# Patient Record
Sex: Female | Born: 1970 | Race: Black or African American | Hispanic: No | Marital: Single | Smoking: Never smoker
Health system: Southern US, Community
[De-identification: ages and names within clinical notes are randomized; demographics above are authoritative.]

## PROBLEM LIST (undated history)

## (undated) DIAGNOSIS — I10 Essential (primary) hypertension: Secondary | ICD-10-CM

## (undated) DIAGNOSIS — E669 Obesity, unspecified: Secondary | ICD-10-CM

## (undated) DIAGNOSIS — D649 Anemia, unspecified: Secondary | ICD-10-CM

## (undated) HISTORY — DX: Obesity, unspecified: E66.9

## (undated) HISTORY — PX: OTHER SURGICAL HISTORY: SHX169

## (undated) HISTORY — DX: Anemia, unspecified: D64.9

## (undated) HISTORY — DX: Essential (primary) hypertension: I10

---

## 1999-01-17 ENCOUNTER — Other Ambulatory Visit: Admission: RE | Admit: 1999-01-17 | Discharge: 1999-01-17 | Payer: Self-pay | Admitting: Obstetrics and Gynecology

## 1999-10-13 ENCOUNTER — Emergency Department (HOSPITAL_COMMUNITY): Admission: EM | Admit: 1999-10-13 | Discharge: 1999-10-14 | Payer: Self-pay | Admitting: Emergency Medicine

## 2003-03-17 ENCOUNTER — Other Ambulatory Visit: Admission: RE | Admit: 2003-03-17 | Discharge: 2003-03-17 | Payer: Self-pay | Admitting: Obstetrics and Gynecology

## 2004-07-05 ENCOUNTER — Encounter: Admission: RE | Admit: 2004-07-05 | Discharge: 2004-07-05 | Payer: Self-pay | Admitting: Family Medicine

## 2007-05-15 ENCOUNTER — Emergency Department (HOSPITAL_COMMUNITY): Admission: EM | Admit: 2007-05-15 | Discharge: 2007-05-15 | Payer: Self-pay | Admitting: Emergency Medicine

## 2007-05-21 ENCOUNTER — Ambulatory Visit (HOSPITAL_COMMUNITY): Admission: RE | Admit: 2007-05-21 | Discharge: 2007-05-22 | Payer: Self-pay | Admitting: Orthopaedic Surgery

## 2007-11-19 IMAGING — CR DG FOOT COMPLETE 3+V*L*
3 series · 3 of 3 positions shown · non-contrast
Comparison: None
COMPARISON: None

CLINICAL DATA: Trauma

LEFT FOOT - 3 VIEW
CLINICAL DATA: Same as above
LEFT ANKLE - 3 VIEW

[t foot ap left]
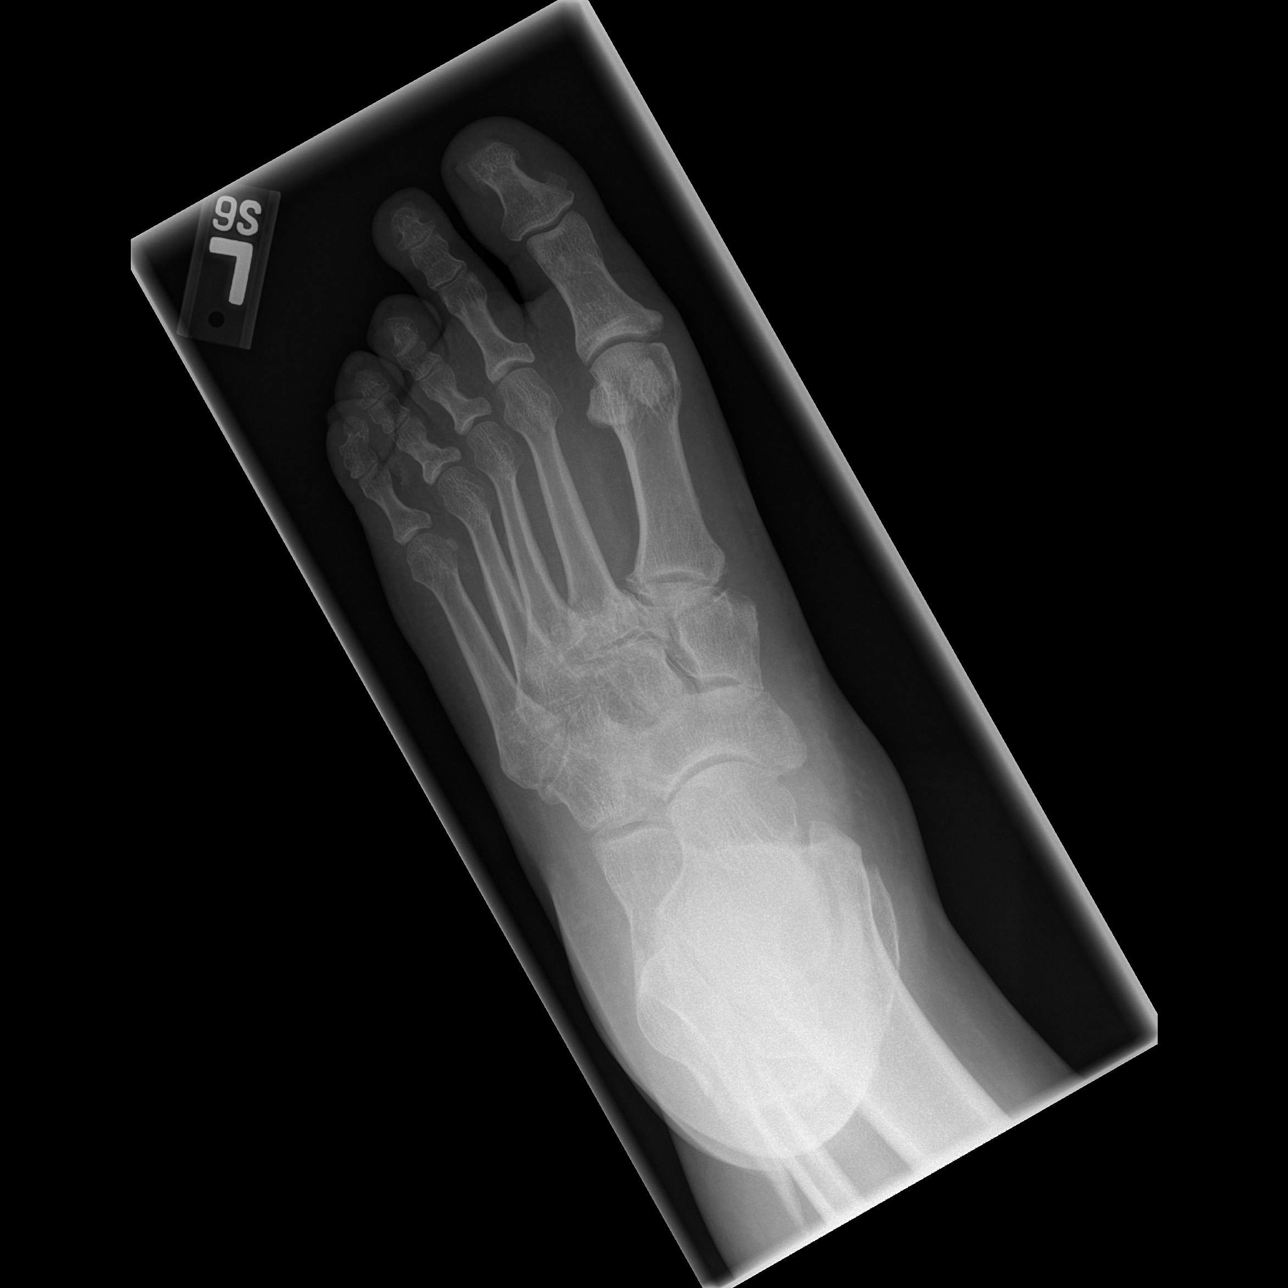

[t foot oblique left]
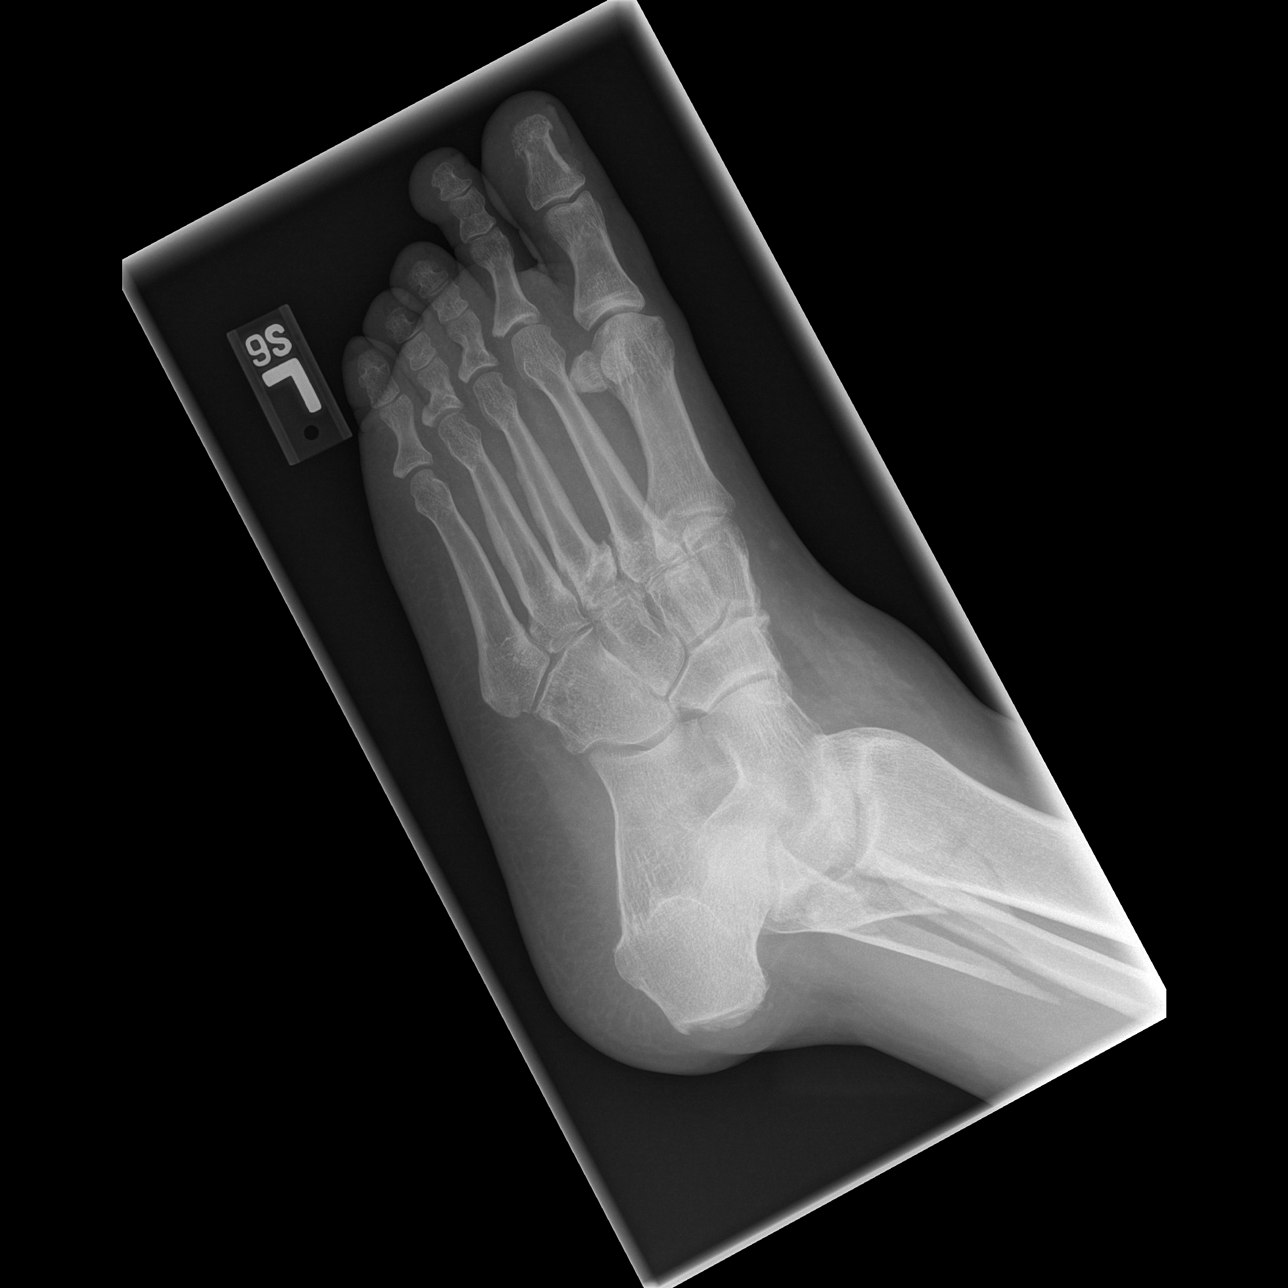

[t foot lat left]
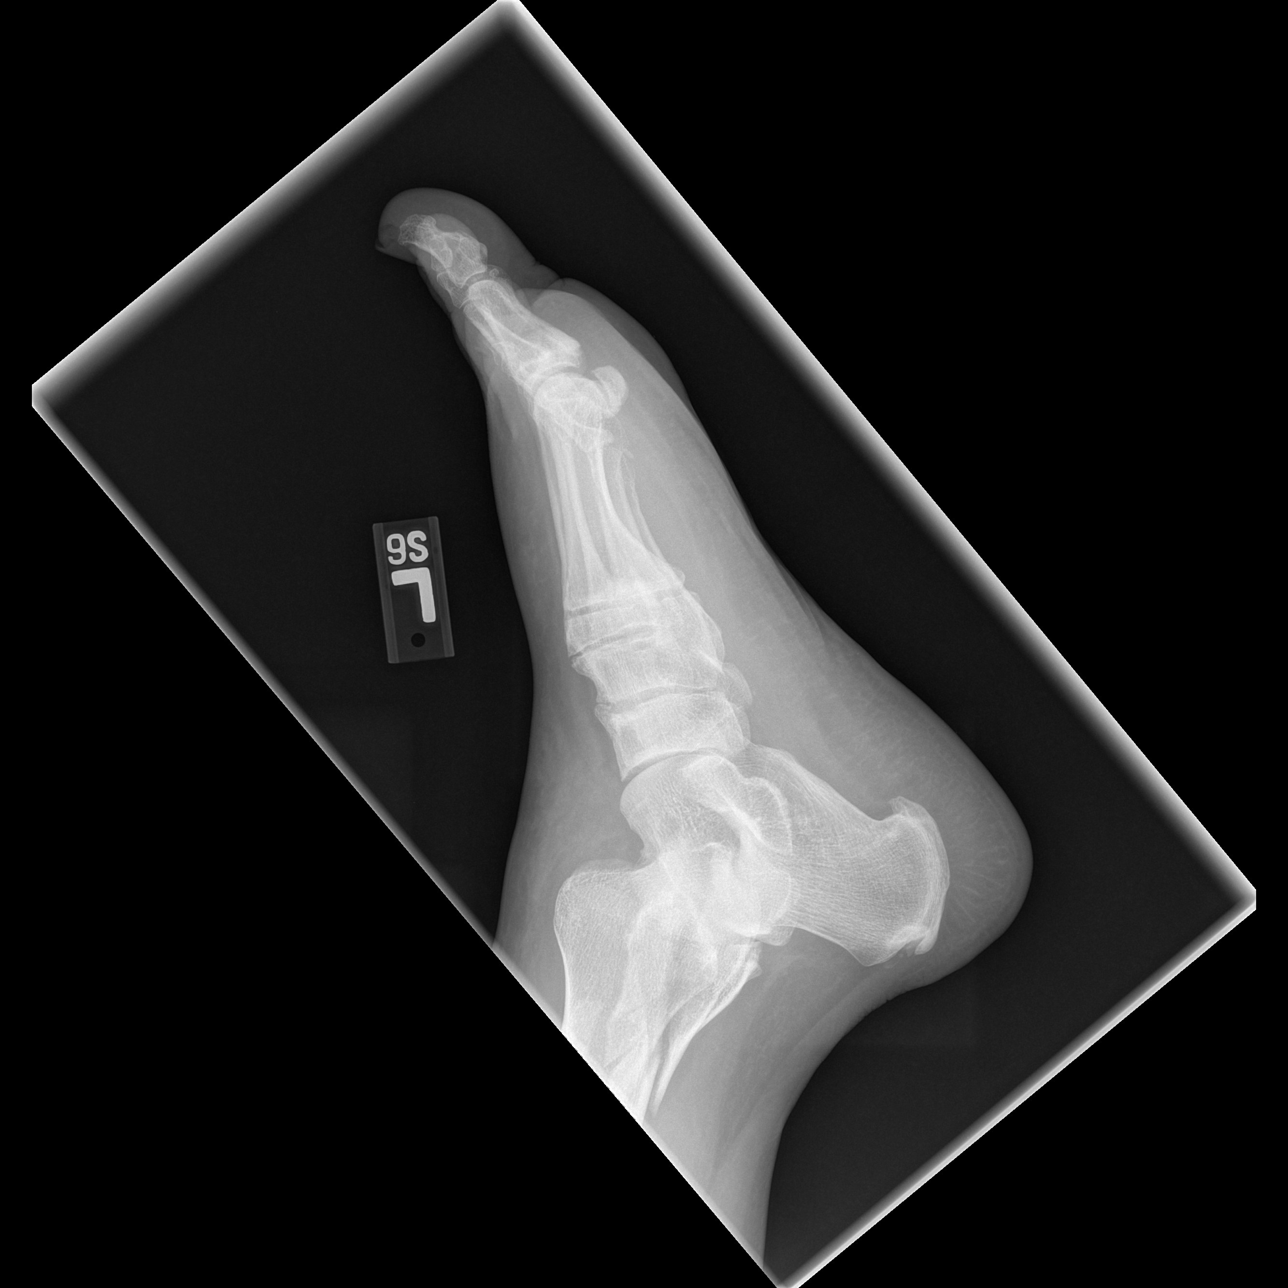

[3 of 3 positions shown; findings below may reference images not displayed]

FINDINGS: Left ankle fractures to be described below. dorsal midfoot soft
tissue swelling. Small Achilles and calcaneal spurs.

Irregularity at the tarsal/metatarsal joints medially felt to be related to age
advanced degenerative change/arthropathy. No acute foot fracture

IMPRESSION

1. Ankle fractures to be described below.
2. No convincing evidence of foot fracture. Irregularity at the tarsometatarsal
joints medially is felt to be due to age advanced degenerative change. Correlate
with any risk factors for other arthropathies (diabetes/Charcot joint). If
patient undergoes ankle CT, extension through the midfoot should be considered
if there are midfoot symptoms.
3. Dorsal soft tissue swelling.
Please note this is part of a multistudy report.  Please see remainder of
report.
FINDINGS: Bimalleolar soft tissue swelling. Distortion of AP and mortise views
due to the extent of fracture subluxation. Comminuted distal fibular fracture.
Comminuted intra-articular medial malleolar fracture. Widening of ankle mortise
medially. Lateral view demonstrates posterior subluxation of the talus relative
to the tibia with a posterior malleolar fracture identified. No definite talar
dome injury. Base of fifth metatarsal appears intact.

IMPRESSION

1. Complex fracture subluxation of the ankle as described. Consider further
evaluation with CT.
Please note this is part of a multistudy report.  Please see remainder of
report.

## 2008-06-10 ENCOUNTER — Encounter: Admission: RE | Admit: 2008-06-10 | Discharge: 2008-06-10 | Payer: Self-pay | Admitting: Obstetrics and Gynecology

## 2010-05-13 ENCOUNTER — Ambulatory Visit: Payer: Self-pay | Admitting: Oncology

## 2011-01-24 NOTE — Op Note (Signed)
Debbie Randall, Debbie Randall NO.:  [id number]   MEDICAL RECORD NO.:  [id number]          PATIENT TYPE:  OIB   LOCATION:  5014                         FACILITY:  [facility]   PHYSICIAN:  Vanita Panda. Magnus Ivan, M.D.DATE OF BIRTH:  1971/02/15   DATE OF PROCEDURE:  DATE OF DISCHARGE:                               OPERATIVE REPORT   PREOPERATIVE DIAGNOSIS:  Left ankle pilon fracture.   POSTOPERATIVE DIAGNOSIS:  Left ankle pilon fracture.   PROCEDURE:  Open reduction internal fixation of left pilon fracture  including fixation of the fibula and tibia.   SURGEON:  Vanita Panda. Magnus Ivan, M.D.   ANESTHESIA:  General.   ANTIBIOTICS:  Two grams IV Ancef.   TOURNIQUET TIME:  Two hours.   BLOOD LOSS:  Minimal.   COMPLICATIONS:  None.   INDICATIONS:  Briefly Debbie Randall is a 40 year old who last week actually  tripped and fell down some stairs while at home.  She sustained an  injury to her left ankle.  She was seen in the emergency room, and a CT  scan was obtained of the ankle and showed an intra-articular pilon  fracture with minimal displacement.  Due to the unstable nature of this  fracture, it was recommend she undergo open reduction and internal  fixation of both the fibula and the tibia.  The risks and benefits of  this were explained her and well understood.  She agreed to proceed with  surgery.   PROCEDURE DESCRIPTION:  After informed consent was obtained, the  appropriate left ankle was marked.  Debbie Randall was brought to the  operating room, placed supine on the operating table.  General  anesthesia was then obtained.  Her left leg was then prepped and draped  with DuraPrep and sterile drapes.  A nonsterile tourniquet was placed  around her upper thigh.  Timeout was called to identify the correct  patient, the correct extremity.  We then used an Esmarch wrap on the leg  and the tourniquet was inflated to 350 mm of pressure.  First made an  incision directly over  the fibula for fixation of the fibula.  Once I  had carried the dissection down the soft tissues to the fibula I was  able to clean the fracture hematoma.  Using reduction forceps I was able  to reduce the fibula into an anatomic position.  I placed 2 lag screws  from anterior to posterior oblique to the fracture plane and then  supplemented this with a 1/3 semitubular locking plate as a buttress  plate on the lateral cortex of the fibula.  Once this was secured under  direct fluoroscopic guidance I found the fibula to be reduced.  I  irrigated the tissues and closed the deep tissue with 0 Vicryl over the  incision followed by 2-0 Vicryl in the subcutaneous tissue and  interrupted 2-0 nylon on the skin.   Next, attention was turned toward fixation of the tibia.  An incision  was made directly over the medial malleolus and I extended this  proximally and distally.  I was able to dissect down  to the fracture  fragments and found 3 large fracture fragments.  I was able to tease the  fracture fragments into place including reduction of the articular  surface, and this was again accomplished under direct fluoroscopic  guidance.  I then chose a Synthes periarticular distal tibial locking  plate which is a distal medial locking plate and passed this along the  cortex.  This was then secured with screws proximally and distally with  some of those proximal to the articular surface to further buttress the  fracture.  Again, this was performed under direct fluoroscopic guidance.  I put the ankle, after this, through a range of motion and it was found  to be stable fixation.  I then irrigated the medial wound and closed the  deep tissue with 0 Vicryl followed by 2-0 Vicryl in the subcutaneous  tissue and 2-0 nylon on the skin.  Xeroform __________  well-padded  sterile dressing were applied.  The tourniquet was let down and  hemostasis was obtained.  The toes did pinken nicely.  A well-padded   splint was applied.  The patient was awakened, extubated and taken to  the recovery room in stable condition.      Vanita Panda. Magnus Ivan, M.D.  Electronically Signed     CYB/MEDQ  D:  05/21/2007  T:  05/22/2007  Job:  [id number]

## 2011-06-23 LAB — BASIC METABOLIC PANEL
BUN: 8
CO2: 26
Calcium: 9.4
Creatinine, Ser: 0.58
Glucose, Bld: 108 — ABNORMAL HIGH

## 2011-06-23 LAB — CBC
Hemoglobin: 12.4
MCHC: 33.7
RDW: 14.1 — ABNORMAL HIGH

## 2012-09-23 ENCOUNTER — Other Ambulatory Visit: Payer: Self-pay | Admitting: Obstetrics and Gynecology

## 2012-09-23 DIAGNOSIS — Z1231 Encounter for screening mammogram for malignant neoplasm of breast: Secondary | ICD-10-CM

## 2012-10-22 ENCOUNTER — Ambulatory Visit
Admission: RE | Admit: 2012-10-22 | Discharge: 2012-10-22 | Disposition: A | Discharge status: Home / Self Care | Payer: PRIVATE HEALTH INSURANCE | Source: Ambulatory Visit | Attending: Obstetrics and Gynecology | Admitting: Obstetrics and Gynecology

## 2012-10-22 DIAGNOSIS — Z1231 Encounter for screening mammogram for malignant neoplasm of breast: Secondary | ICD-10-CM

## 2013-11-14 ENCOUNTER — Other Ambulatory Visit: Payer: Self-pay

## 2013-11-14 DIAGNOSIS — Z1231 Encounter for screening mammogram for malignant neoplasm of breast: Secondary | ICD-10-CM

## 2013-12-04 ENCOUNTER — Ambulatory Visit
Admission: RE | Admit: 2013-12-04 | Discharge: 2013-12-04 | Disposition: A | Discharge status: Home / Self Care | Payer: No Typology Code available for payment source | Source: Ambulatory Visit

## 2013-12-04 DIAGNOSIS — Z1231 Encounter for screening mammogram for malignant neoplasm of breast: Secondary | ICD-10-CM

## 2014-06-17 ENCOUNTER — Encounter: Payer: Self-pay | Admitting: Cardiovascular Disease

## 2014-06-17 ENCOUNTER — Ambulatory Visit (INDEPENDENT_AMBULATORY_CARE_PROVIDER_SITE_OTHER): Payer: No Typology Code available for payment source | Admitting: Cardiovascular Disease

## 2014-06-17 VITALS — BP 164/98 | HR 87 | Ht 64.0 in | Wt 286.0 lb

## 2014-06-17 DIAGNOSIS — R0602 Shortness of breath: Secondary | ICD-10-CM

## 2014-06-17 DIAGNOSIS — R011 Cardiac murmur, unspecified: Secondary | ICD-10-CM | POA: Insufficient documentation

## 2014-06-17 DIAGNOSIS — I10 Essential (primary) hypertension: Secondary | ICD-10-CM | POA: Insufficient documentation

## 2014-06-17 DIAGNOSIS — R0600 Dyspnea, unspecified: Secondary | ICD-10-CM | POA: Insufficient documentation

## 2014-06-17 DIAGNOSIS — R600 Localized edema: Secondary | ICD-10-CM | POA: Insufficient documentation

## 2014-06-17 NOTE — Assessment & Plan Note (Signed)
Debbie Randall  presents for further evaluation of her cardiac murmur. Murmur sounds fairly benign. She may have small amount of mitral regurgitation or tricuspid regurgitation.  We'll get an echocardiogram for further evaluation. She also has a history of shortness breath and hypertension.
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
## 2014-06-17 NOTE — Assessment & Plan Note (Signed)
I think this is due to generalized deconditioning and her obesity. Her BNP is 5 - no evidence of CHf. I've encouraged her to exercise on a regular basis.   I will see her on an as needed basis.

## 2014-06-17 NOTE — Patient Instructions (Addendum)
Your physician has requested that you have an echocardiogram. Echocardiography is a painless test that uses sound waves to create images of your heart. It provides your doctor with information about the size and shape of your heart and how well your heart's chambers and valves are working. This procedure takes approximately one hour. There are no restrictions for this procedure.  Your physician recommends that you continue on your current medications as directed. Please refer to the Current Medication list given to you today.  Your physician recommends that you schedule a follow-up appointment in: as needed with Dr. Nahser   

## 2014-06-17 NOTE — Assessment & Plan Note (Signed)
Primarily due to an ankle injury . No evidnece of CHf BNP = 5

## 2014-06-17 NOTE — Assessment & Plan Note (Signed)
Her blood pressure has been managed by her medical doctor. I think that she still feels a bit too much salt. We'll have her Dr. continue to work with her for bdetter control of her blood pressure.

## 2014-06-23 ENCOUNTER — Other Ambulatory Visit (HOSPITAL_COMMUNITY): Payer: No Typology Code available for payment source

## 2014-06-24 ENCOUNTER — Encounter (HOSPITAL_COMMUNITY): Payer: Self-pay | Admitting: Cardiovascular Disease

## 2014-07-21 ENCOUNTER — Other Ambulatory Visit: Payer: Self-pay | Admitting: Family Medicine

## 2014-07-21 DIAGNOSIS — R1011 Right upper quadrant pain: Secondary | ICD-10-CM

## 2014-07-29 ENCOUNTER — Ambulatory Visit
Admission: RE | Admit: 2014-07-29 | Discharge: 2014-07-29 | Disposition: A | Discharge status: Home / Self Care | Payer: No Typology Code available for payment source | Source: Ambulatory Visit | Attending: Family Medicine | Admitting: Family Medicine

## 2014-07-29 DIAGNOSIS — R1011 Right upper quadrant pain: Secondary | ICD-10-CM

## 2015-02-02 IMAGING — US US ABDOMEN COMPLETE
1 series · 14 of 25 positions shown · non-contrast
Comparison: No priors.

CLINICAL DATA: 43-year-old female with right upper quadrant
abdominal pain for the past 2 weeks. No associated nausea or
vomiting.

EXAM:
ULTRASOUND ABDOMEN COMPLETE

[Series 1: us abdomen complete · 0.30mm/px · 14 of 81 slices shown]
[im 1/81]
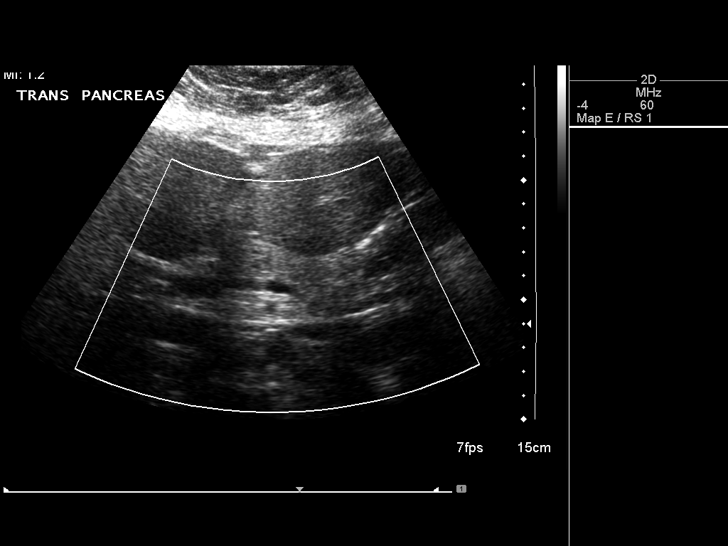
[im 7/81]
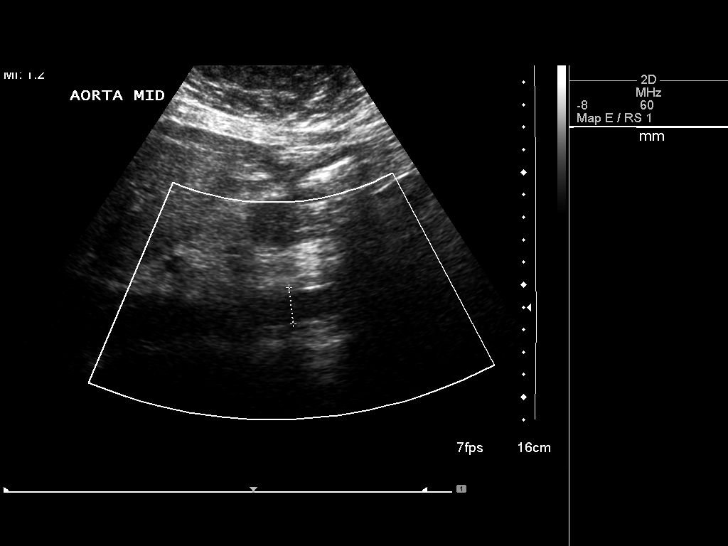
[im 14/81]
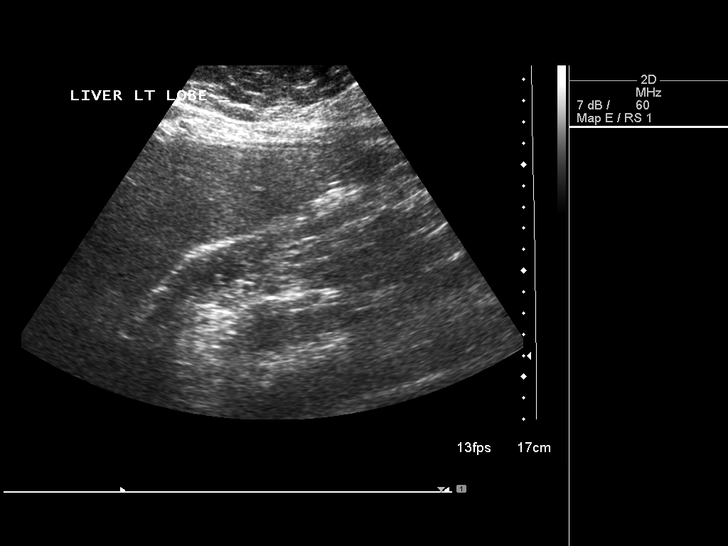
[im 21/81]
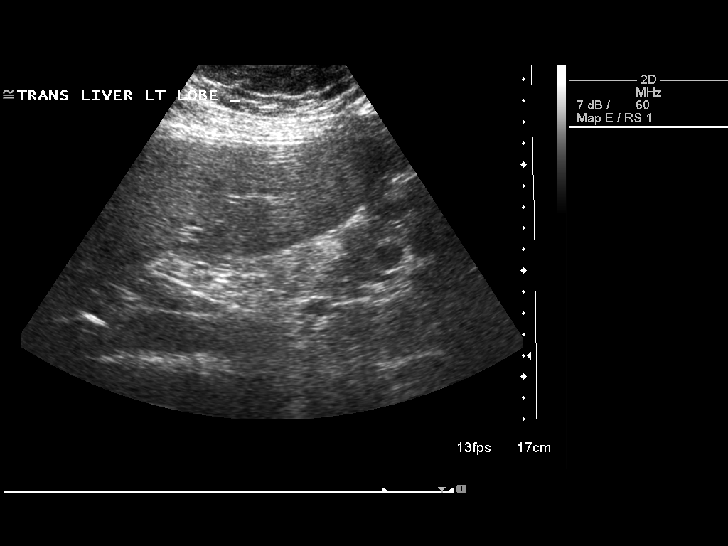
[im 27/81]
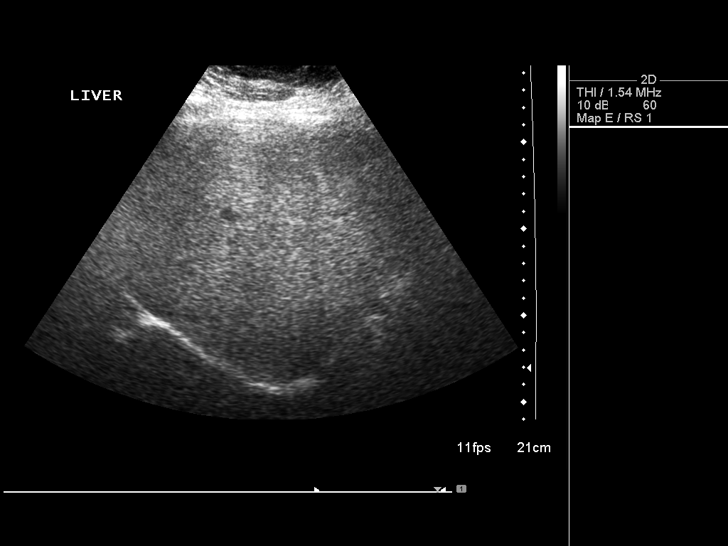
[im 31/81]
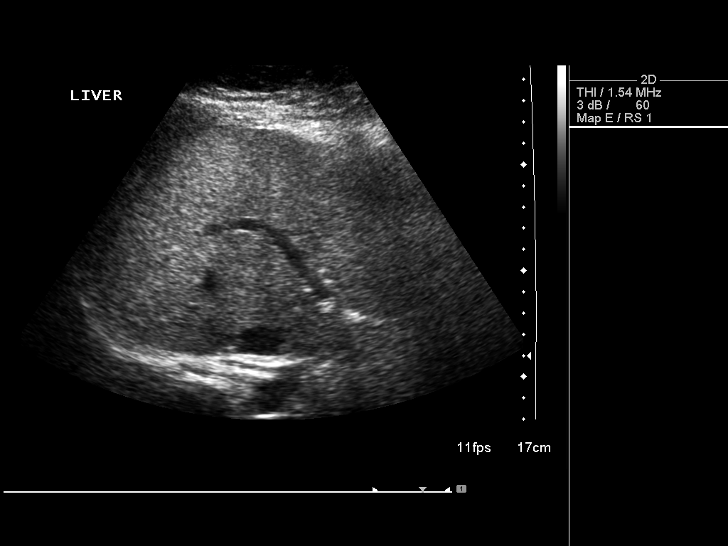
[im 37/81]
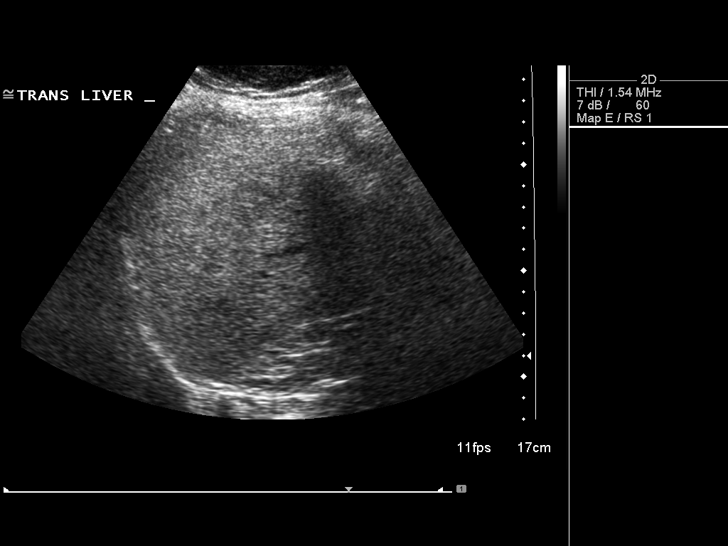
[im 44/81]
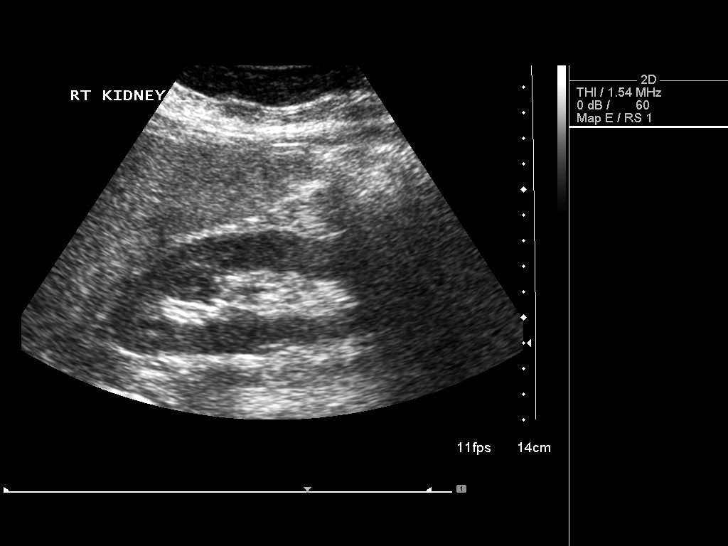
[im 51/81]
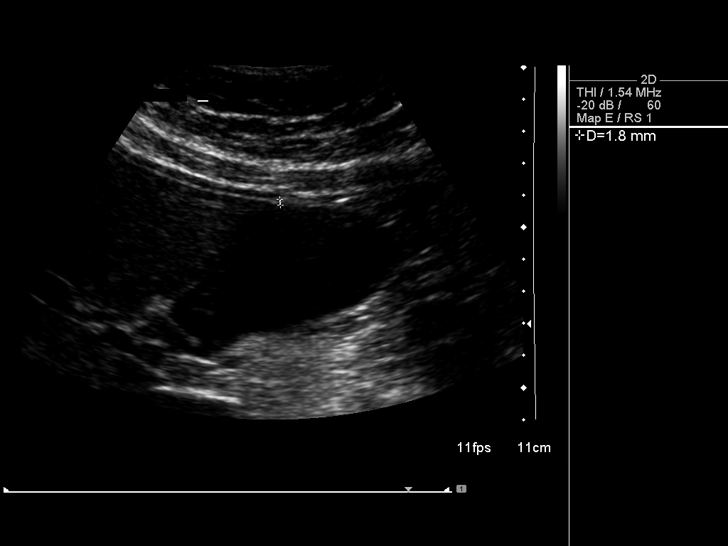
[im 54/81]
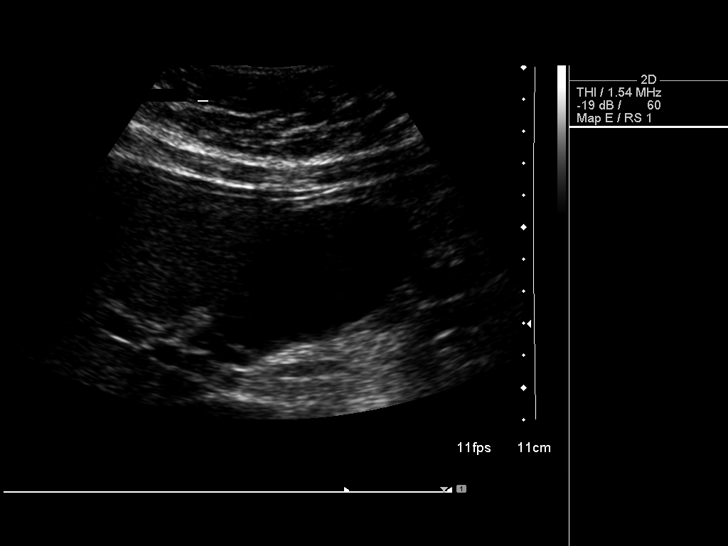
[im 61/81]
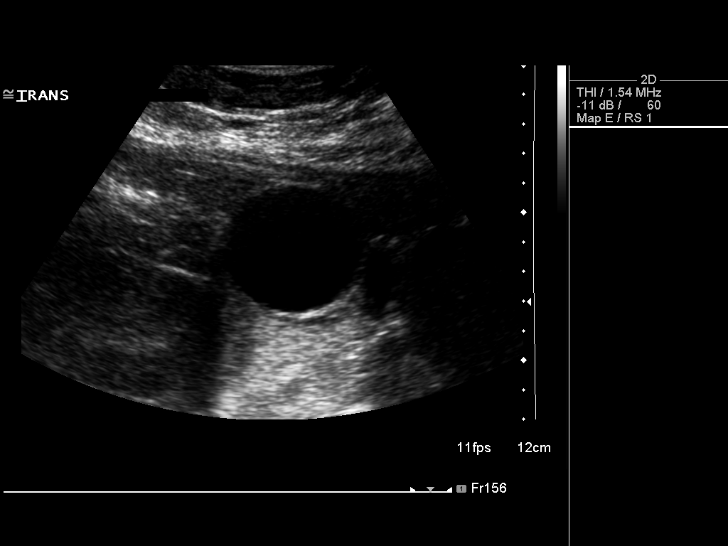
[im 67/81]
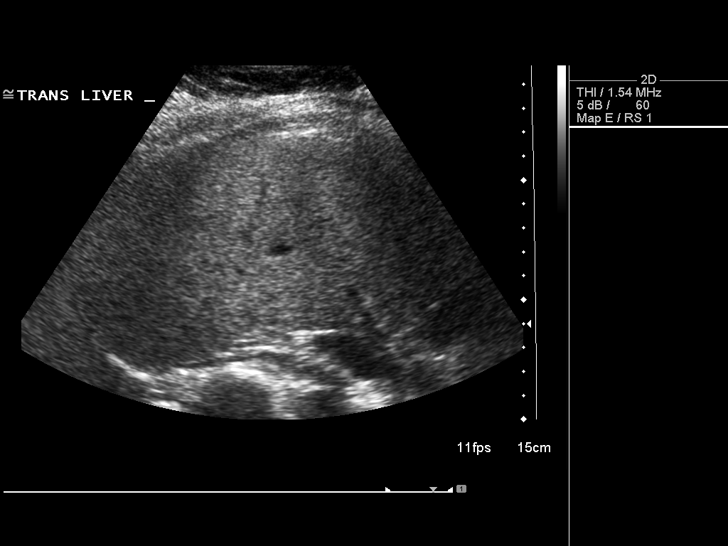
[im 74/81]
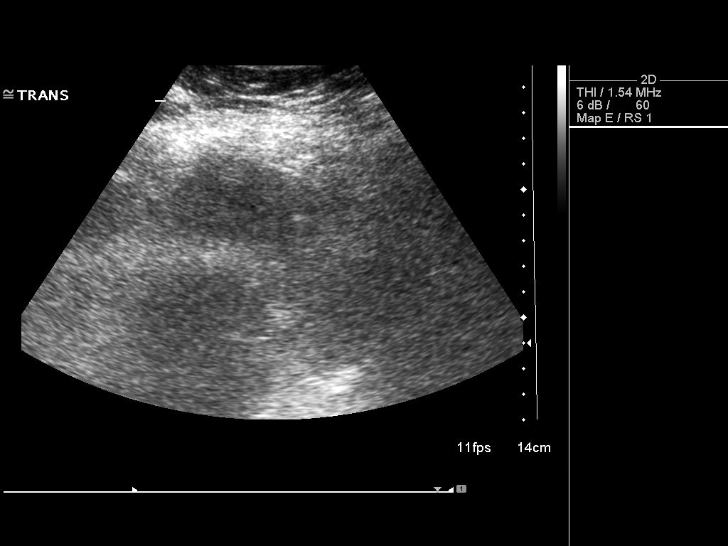
[im 81/81]
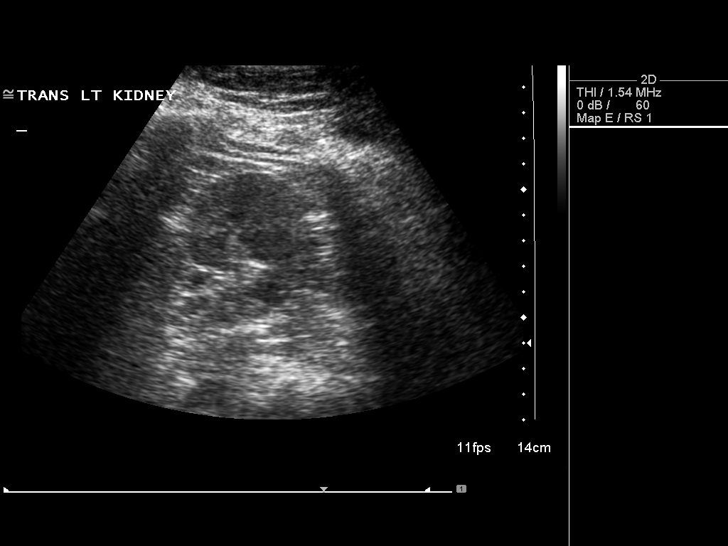

[14 of 25 positions shown; findings below may reference images not displayed]

FINDINGS: Gallbladder: No gallstones or wall thickening visualized. No
sonographic Murphy sign noted.

Common bile duct: Diameter: 3.3 mm

Liver: No focal lesion identified. Heterogeneously increased
echogenicity in the hepatic parenchyma.

IVC: No abnormality visualized.

Pancreas: Visualized portion unremarkable.

Spleen: Size and appearance within normal limits.  5.6 cm in length.

Right Kidney: Length: 11.4 cm. Echogenicity within normal limits. No
mass or hydronephrosis visualized.

Left Kidney: Length: 11.7 cm. Echogenicity within normal limits. No
mass or hydronephrosis visualized.

Abdominal aorta: No aneurysm visualized.

Other findings: None.
IMPRESSION: 1. No acute findings.
2. Heterogeneously increased echogenicity in the hepatic parenchyma,
suggestive of hepatic steatosis.
